# Patient Record
Sex: Female | Born: 2000 | Race: Black or African American | Hispanic: No | Marital: Single | Smoking: Never smoker
Health system: Southern US, Community
[De-identification: ages and names within clinical notes are randomized; demographics above are authoritative.]

## PROBLEM LIST (undated history)

## (undated) DIAGNOSIS — M1990 Unspecified osteoarthritis, unspecified site: Secondary | ICD-10-CM

---
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
## 2023-10-31 ENCOUNTER — Telehealth: Payer: Self-pay

## 2023-10-31 ENCOUNTER — Ambulatory Visit (INDEPENDENT_AMBULATORY_CARE_PROVIDER_SITE_OTHER)

## 2023-10-31 DIAGNOSIS — L700 Acne vulgaris: Secondary | ICD-10-CM

## 2023-10-31 DIAGNOSIS — L810 Postinflammatory hyperpigmentation: Secondary | ICD-10-CM

## 2023-10-31 MED ORDER — RETIN-A 0.025 % EX CREA: TOPICAL_CREAM | CUTANEOUS | 1 refills | Status: DC

## 2023-10-31 MED ORDER — DOXYCYCLINE MONOHYDRATE 100 MG PO CAPS: ORAL_CAPSULE | ORAL | 2 refills | Status: AC

## 2023-10-31 MED ORDER — TRETINOIN 0.025 % EX CREA: TOPICAL_CREAM | Freq: Every day | CUTANEOUS | 5 refills | Status: DC

## 2023-10-31 MED ORDER — DOXYCYCLINE MONOHYDRATE 100 MG PO TABS: 100.0000 mg | ORAL_TABLET | Freq: Two times a day (BID) | ORAL | 2 refills | Status: DC

## 2023-10-31 MED ORDER — CLINDAMYCIN PHOS (TWICE-DAILY) 1 % EX GEL: Freq: Two times a day (BID) | CUTANEOUS | 5 refills | Status: AC

## 2023-10-31 NOTE — Progress Notes (Signed)
    Subjective   Christy Pierce is a 23 y.o. female who presents for the following: Lesion(s) of concern . Patient is new patient  Today patient reports: Acne and dark spots. Face and back. Has used Adapalene  0.3% gel, Epiduo  gel and has taken Spironolactone  in the past. Currently using Retin A 0.025% cream, for the past year.   Review of Systems:    No other skin or systemic complaints except as noted in HPI or Assessment and Plan.  The following portions of the chart were reviewed this encounter and updated as appropriate: medications, allergies, medical history  Relevant Medical History:  n/a   Objective  Well appearing patient in no apparent distress; mood and affect are within normal limits. Examination was performed of the: Focused Exam of: face, chest, back   Examination notable for: Acne vulgaris: Scattered open and closed comedones on the forehead, cheeks, chin. Red, inflammatory papules and pustules on forehead, cheeks, chin   Examination limited by: Clothing     Assessment & Plan   Acne vulgaris - moderate, inflammatory/comedonal, with PIH  - Chronic and persistent condition with duration or expected duration over one year. Condition is symptomatic and bothersome to patient. Patient is flaring and not currently at treatment goal.  - Prior treatments: Epiduo , tretinoin, spironolactone  (Caused irregular periods), differin , duac   - Discussed various treatment options with patient, as well as need for consistent use for at least 6-12 weeks for full efficacy.  - Reviewed treatment options, including side effects of topical agents, oral antibiotics, OCPs (if female), oral spironolactone  (if female), and isotretinoin. Discussed that isotretinoin is the most effective  - After discussion opted to initiate:   start tretinoin 0.025% cream in the evening. Educated patient about proper use and potential side effects, including dryness, irritation, sun sensitivity, and transient  worsening of acne. - Start topical clindamycin  gel 1% daily to active areas  - Start doxycycline 100 mg BID  - Discussed side effects and precautions with doxycycline including taking with meal, waiting at least 30 minutes before lying down at night, increased sun sensitivity, and to stop medication if becomes pregnant or breastfeeding - Recommended tinted sunscreen for PIH, samples provided  - Consider isotretinoin at next visit if not improved  Chronic and persistent condition with duration or expected duration over one year. Condition is symptomatic and bothersome to patient. Patient is flaring and not currently at treatment goal.   If not improving at next visit consider Isotretinoin therapy.   Procedures, orders, diagnosis for this visit:    There are no diagnoses linked to this encounter.  Return to clinic: Return in about 3 months (around 01/31/2024) for Acne Follow Up.  I, Kate Fought, CMA, am acting as scribe for Lauraine JAYSON Kanaris, MD.   Documentation: I have reviewed the above documentation for accuracy and completeness, and I agree with the above.  Lauraine JAYSON Kanaris, MD

## 2023-10-31 NOTE — Patient Instructions (Signed)
 Plan for Acne  In the morning: - Cleanse face with a gentle cleanser OR benzoyl peroxide wash if instructed to use this at your visit(can be purchased over the counter- examples at the bottom) - Wipe face with clindamycin  wipe if prescribed at your visit. This can be used on entire face/chest/back or as a spot treatment to active acne areas  - Apply an oil-free moisturizer  In the evening: - Cleanse face with a regular gentle face wash - Wait for skin to completely dry - Apply a pea sized amount of your retinoid (tretinoin  or adapalene) to your finger. Dot this around your face, and then rub in - If your skin gets dry, you can follow this up with an oil-free moisturizer  If you were instructed to take minocycline or doxycycline. This is the antibiotic we talked about that you will take twice per day for a 3 month course. Take the medication with food, and don't lie down right after taking it, because it can give you heart burn if you lie down right away.  How to use your Acne creams  Some creams for acne PREVENT acne and some creams TARGET pimples you can see.  Antibiotic creams (erythromycin, benzoyl peroxide, clindamycin  and sulfur sulfacetamide)  How to apply: generally applied once daily either as a spot treatment or all over the face (see above)  Retinoids (differin/adapalene, retin-A /tretinoin  or tazorac) work by PREVENTING acne.  These medicines are good to control acne, but may cause dryness and increase your risk of sunburn.  Do not use if you are PREGNANT or BREAST FEEDING. It takes 4-6 weeks to have results and the acne may worsen in the beginning.  Some retinoids are stronger than others, but are also more irritating. These are prescription topical medications, used to treat acne, sun damage, fine wrinkles, and several other skin changes on the face. Medications in this family include tretinoin /Retin-A , adapalene/Differin, and Tazorac, among others.   A Note on Cosmetic  Use: - Please note, if you are over the age of 7, insurance will typically not cover these medications. If they are recommended to you for non-medically necessary reasons, you may choose to pay for the medication out of pocket. Prices vary, but a tube of generic tretinoin  can often be purchased for ~$60-100 (this size often lasts several months). This varies considerably, and we recommended that you check www.goodrx.com for prescription discount coupons and to compare prices across pharmacies.   How to apply: Use at night time (sunlight makes them inactive). If you wash your face at night, let the skin dry for 20 minutes before applying. Apply to all areas that have breakouts (NOT just to pimples you see). Use a PEA-SIZED amount for the entire face (no more) Dot it on your skin and connect the dots Avoid eyes and lips These may cause irritation in the beginning. To decrease irritation, do the following: 1st month: Use twice a week for the first month  2nd month: Use every other night 3rd month and on: Use every night  Cleansing your skin: -   Do not use harsh "acne" soaps and astringents. - Use water to clean your face.  - If you wear make-up, sunscreen or creams, use non-comedogenic (non-pore clogging) gentle moisturizing wash or cream. Examples include: Cetaphil, Neutrogena, Clinique  Moisturizers and sunscreen: Apply a "non-comedogenic" (non-pore clogging) lotion with sunscreen in the morning. You may need to re-apply during the day. Neutrogena, Eucerin, Clinique, Vanicream    If you have dryness  or irritation, try these tips: Decrease use to every other night or twice weekly as tolerated  Wash the retinoid off after 1 hour and apply a "non-comedogenic" (non-pore-clogging) lotion If dryness or irritation continues, stop using the retinoid.   Benzoyl peroxide washes 3-5% (brand name includes Neutragena Clear Pore, CereVe Acne Foaming Cream Cleanser, Differin Cleanser) that you use  in the shower. Can bleach linens (clothes, towels, etc), will NOT bleach your skin or hair). Dry off with a white towel so it doesn't take the color out of clothing and colored towels.         Due to recent changes in healthcare laws, you may see results of your pathology and/or laboratory studies on MyChart before the doctors have had a chance to review them. We understand that in some cases there may be results that are confusing or concerning to you. Please understand that not all results are received at the same time and often the doctors may need to interpret multiple results in order to provide you with the best plan of care or course of treatment. Therefore, we ask that you please give us  2 business days to thoroughly review all your results before contacting the office for clarification. Should we see a critical lab result, you will be contacted sooner.   If You Need Anything After Your Visit  If you have any questions or concerns for your doctor, please call our main line at [phone] and press option 4 to reach your doctor's medical assistant. If no one answers, please leave a voicemail as directed and we will return your call as soon as possible. Messages left after 4 pm will be answered the following business day.   You may also send us  a message via MyChart. We typically respond to MyChart messages within 1-2 business days.  For prescription refills, please ask your pharmacy to contact our office. Our fax number is [phone].  If you have an urgent issue when the clinic is closed that cannot wait until the next business day, you can page your doctor at the number below.    Please note that while we do our best to be available for urgent issues outside of office hours, we are not available 24/7.   If you have an urgent issue and are unable to reach us , you may choose to seek medical care at your doctor's office, retail clinic, urgent care center, or emergency room.  If you  have a medical emergency, please immediately call 911 or go to the emergency department.  Pager Numbers  - Dr. Hester: [phone]  - Dr. Jackquline: [phone]  - Dr. Claudene: [phone]   - Dr. Raymund: [phone]  In the event of inclement weather, please call our main line at [phone] for an update on the status of any delays or closures.  Dermatology Medication Tips: Please keep the boxes that topical medications come in in order to help keep track of the instructions about where and how to use these. Pharmacies typically print the medication instructions only on the boxes and not directly on the medication tubes.   If your medication is too expensive, please contact our office at [phone] option 4 or send us  a message through MyChart.   We are unable to tell what your co-pay for medications will be in advance as this is different depending on your insurance coverage. However, we may be able to find a substitute medication at lower cost or fill out paperwork to get insurance to cover  a needed medication.   If a prior authorization is required to get your medication covered by your insurance company, please allow us  1-2 business days to complete this process.  Drug prices often vary depending on where the prescription is filled and some pharmacies may offer cheaper prices.  The website www.goodrx.com contains coupons for medications through different pharmacies. The prices here do not account for what the cost may be with help from insurance (it may be cheaper with your insurance), but the website can give you the price if you did not use any insurance.  - You can print the associated coupon and take it with your prescription to the pharmacy.  - You may also stop by our office during regular business hours and pick up a GoodRx coupon card.  - If you need your prescription sent electronically to a different pharmacy, notify our office through [facility] or by phone  at [phone] option 4.     Si Usted Necesita Algo Despus de Su Visita  Tambin puede enviarnos un mensaje a travs de Clinical cytogeneticist. Por lo general respondemos a los mensajes de MyChart en el transcurso de 1 a 2 das hbiles.  Para renovar recetas, por favor pida a su farmacia que se ponga en contacto con nuestra oficina. Randi lakes de fax es [place] [phone].  Si tiene un asunto urgente cuando la clnica est cerrada y que no puede esperar hasta el siguiente da hbil, puede llamar/localizar a su doctor(a) al nmero que aparece a continuacin.   Por favor, tenga en cuenta que aunque hacemos todo lo posible para estar disponibles para asuntos urgentes fuera del horario de [place], no estamos disponibles las 24 horas del da, los 7 [address]  [address] de la [place].   Si tiene un problema urgente y no puede comunicarse con nosotros, puede optar por buscar atencin mdica  en el consultorio de su doctor(a), en una clnica privada, en un centro de atencin urgente o en una sala de emergencias.  Si tiene Engineer, drilling, por favor llame inmediatamente al 911 o vaya a la sala de emergencias.  Nmeros de bper  - Dr. Hester: [phone]  - Dra. Jackquline: [phone]  - Dr. Claudene: [phone]  - Dra. Kitts: [phone]  En caso de inclemencias del [place], por favor llame a nuestra lnea principal al [phone] para una actualizacin sobre el estado de cualquier retraso o cierre.  Consejos para la medicacin en dermatologa: Por favor, guarde las cajas en las que vienen los medicamentos de uso tpico para ayudarle a seguir las instrucciones sobre dnde y cmo usarlos. Las farmacias generalmente imprimen las instrucciones del medicamento slo en las cajas y no directamente en los tubos del [place].   Si su medicamento es muy caro, por favor, pngase en contacto con landry rieger llamando al [phone] y presione la opcin 4 o envenos un mensaje a travs de Clinical cytogeneticist.   No podemos  decirle cul ser su copago por los medicamentos por adelantado ya que esto es diferente dependiendo de la cobertura de su seguro. Sin embargo, es posible que podamos encontrar un medicamento sustituto a Audiological scientist un formulario para que el seguro cubra el medicamento que se considera necesario.   Si se requiere una autorizacin previa para que su compaa de seguros malta su medicamento, por favor permtanos de 1 a 2 das hbiles para completar este proceso.  Los precios de los medicamentos varan con frecuencia dependiendo del Environmental consultant de dnde se surte la receta y ir[place]  pueden ofrecer precios ms baratos.  El sitio web www.goodrx.com tiene cupones para medicamentos de Health and safety inspector. Los precios aqu no tienen en cuenta lo que podra costar con la ayuda del seguro (puede ser ms barato con su seguro), pero el sitio web puede darle el precio si no utiliz Tourist information centre manager.  - Puede imprimir el cupn correspondiente y llevarlo con su receta a la farmacia.  - Tambin puede pasar por nuestra oficina durante el horario de atencin regular y Education officer, museum una tarjeta de cupones de GoodRx.  - Si necesita que su receta se enve electrnicamente a una farmacia diferente, informe a nuestra oficina a travs de MyChart de [place] o por telfono llamando al [phone] y presione la opcin 4.

## 2023-10-31 NOTE — Telephone Encounter (Signed)
 Switching prescriptions from today's visit to match Medicaid formulary.

## 2024-01-10 ENCOUNTER — Other Ambulatory Visit: Payer: Self-pay

## 2024-02-03 ENCOUNTER — Ambulatory Visit

## 2024-02-11 ENCOUNTER — Other Ambulatory Visit: Payer: Self-pay

## 2024-02-17 ENCOUNTER — Ambulatory Visit

## 2024-03-02 ENCOUNTER — Ambulatory Visit
